# Patient Record
Sex: Male | Born: 1975 | Race: Black or African American | Hispanic: No | Marital: Single | Smoking: Never smoker
Health system: Southern US, Community
[De-identification: ages and names within clinical notes are randomized; demographics above are authoritative.]

---

## 1998-11-22 ENCOUNTER — Emergency Department (HOSPITAL_COMMUNITY): Admission: EM | Admit: 1998-11-22 | Discharge: 1998-11-22 | Payer: Self-pay | Admitting: Emergency Medicine

## 1999-10-11 ENCOUNTER — Emergency Department (HOSPITAL_COMMUNITY): Admission: EM | Admit: 1999-10-11 | Discharge: 1999-10-11 | Payer: Self-pay | Admitting: Emergency Medicine

## 2002-02-24 ENCOUNTER — Emergency Department (HOSPITAL_COMMUNITY): Admission: EM | Admit: 2002-02-24 | Discharge: 2002-02-24 | Payer: Self-pay | Admitting: Emergency Medicine

## 2004-02-04 ENCOUNTER — Emergency Department (HOSPITAL_COMMUNITY): Admission: AC | Admit: 2004-02-04 | Discharge: 2004-02-04 | Payer: Self-pay

## 2004-02-11 ENCOUNTER — Emergency Department (HOSPITAL_COMMUNITY): Admission: EM | Admit: 2004-02-11 | Discharge: 2004-02-11 | Payer: Self-pay | Admitting: Family Medicine

## 2005-01-29 ENCOUNTER — Emergency Department (HOSPITAL_COMMUNITY): Admission: EM | Admit: 2005-01-29 | Discharge: 2005-01-29 | Payer: Self-pay | Admitting: Family Medicine

## 2005-01-30 ENCOUNTER — Ambulatory Visit (HOSPITAL_COMMUNITY): Admission: RE | Admit: 2005-01-30 | Discharge: 2005-01-30 | Payer: Self-pay | Admitting: Surgery

## 2005-01-30 ENCOUNTER — Encounter (INDEPENDENT_AMBULATORY_CARE_PROVIDER_SITE_OTHER): Payer: Self-pay | Admitting: Specialist

## 2005-02-01 ENCOUNTER — Emergency Department (HOSPITAL_COMMUNITY): Admission: EM | Admit: 2005-02-01 | Discharge: 2005-02-01 | Payer: Self-pay | Admitting: Emergency Medicine

## 2007-08-09 ENCOUNTER — Emergency Department (HOSPITAL_COMMUNITY): Admission: EM | Admit: 2007-08-09 | Discharge: 2007-08-10 | Payer: Self-pay | Admitting: Emergency Medicine

## 2008-02-04 ENCOUNTER — Emergency Department (HOSPITAL_COMMUNITY): Admission: EM | Admit: 2008-02-04 | Discharge: 2008-02-04 | Payer: Self-pay | Admitting: Family Medicine

## 2008-10-05 ENCOUNTER — Emergency Department (HOSPITAL_COMMUNITY): Admission: EM | Admit: 2008-10-05 | Discharge: 2008-10-05 | Payer: Self-pay | Admitting: Family Medicine

## 2009-08-23 ENCOUNTER — Emergency Department (HOSPITAL_COMMUNITY): Admission: EM | Admit: 2009-08-23 | Discharge: 2009-08-23 | Payer: Self-pay | Admitting: Emergency Medicine

## 2010-08-29 NOTE — H&P (Signed)
NAMERHONIN, TROTT NO.:  [id number]   MEDICAL RECORD NO.:  [id number]          PATIENT TYPE:  EMS   LOCATION:  MAJO                         FACILITY:  [facility]   PHYSICIAN:  Clovis Pu. Cornett, M.D.DATE OF BIRTH:  Jul 13, 1975   DATE OF ADMISSION:  02/01/2005  DATE OF DISCHARGE:                                HISTORY & PHYSICAL   CHIEF COMPLAINT:  No bowel movement, difficulty voiding.   HISTORY OF PRESENT ILLNESS:  The patient is a 35 year old male who underwent  an open hemorrhoidectomy on Friday due to gangrenous hemorrhoids.  He has  had some problems voiding.  He was able to void yesterday, but has had  problems since then.  He has the urge to void, but has problems passing his  void.  He is on Flomax 0.4 mg a day.  He also had no bowel movement and  feels he needs to have one.   PHYSICAL EXAMINATION:  On examination today, his operative site is clean,  dry, and intact with no signs of infection.  He is full in his lower  abdomen.   IMPRESSION:  1.  Urine retention.  2.  Constipation.   PLAN:  I will place a Foley catheter in him with a leg bag, and have him  return to my office in 48 hours for removal.  I have told him to go ahead  and take Dulcolax one tablet every 6 hours, as well as milk of magnesia 30  cc p.o. every 4-6 hours until his first bowel movement.  He is not having  any nausea or vomiting or problems drinking liquids or eating at this point  in time.      Hamill A. Cornett, M.D.  Electronically Signed     TAC/MEDQ  D:  02/01/2005  T:  02/01/2005  Job:  [id number]   cc:   [facility]

## 2010-08-29 NOTE — Op Note (Signed)
[name], [name]               ACCOUNT NO.:  [id number]   MEDICAL RECORD NO.:  [id number]          PATIENT TYPE:  AMB   LOCATION:  SDS                          FACILITY:  [facility]   PHYSICIAN:  Diers A. Cornett, M.D.DATE OF BIRTH:  02-22-1976   DATE OF PROCEDURE:  01/30/2005  DATE OF DISCHARGE:  01/30/2005                                 OPERATIVE REPORT   PREOPERATIVE DIAGNOSIS:  Grade 3 prolapsed internal/external hemorrhoids.   POSTOPERATIVE DIAGNOSIS:  Grade 3 prolapsed internal/external hemorrhoids.   PROCEDURE:  Two-column internal/external hemorrhoidectomy.   SURGEON:  Maisie Fus A. Cornett, M.D.   ANESTHESIA:  LMA with 20 cc of 0.25% Sensorcaine local.   ESTIMATED BLOOD LOSS:  100 cc.   SPECIMEN:  Two hemorrhoids to pathology.   DRAINS:  None.   INDICATIONS FOR PROCEDURE:  The patient is a 35 year old male who has had  painful, prolapsed, grade 3 internal hemorrhoids.  He was see yesterday and  had these reduced, but overnight, his symptoms did not improve.  Despite  medical management, he returned to the office today.  We offered him  hemorrhoidectomy for control of his pain.  The procedure was explained to  him, and he voiced understanding and agreed to proceed.   DESCRIPTION OF PROCEDURE:  The patient was brought to the operating suite  and placed supine.  After induction of LMA anesthesia, he was placed in  lithotomy.  The perineum was prepped and draped in a sterile fashion.  The  scrotum and testes were wrapped in a towel and taped out of the way.  Digital examination revealed normal tone and a very large, grade 3,  minimally necrotic hemorrhoid in the right anterior position.  He had a  smaller hemorrhoid in the left lateral position which was also grade 3 and  was no prolapsed quite as far.  A proctoscope was placed, and the hemorrhoid  in the right lateral position was identified.  I injected this area with  0.25% Sensorcaine.  A stitch of 3-0 Monocryl was  placed at the apex of the  hemorrhoid, and a curved Mayo was used to cut the internal and external  component out, staying parallel to the sphincter complex.  Once this was  done, it was passed off the field.  I oversewed the mucosa with the 3-0  Monocryl and tied it at the end, leaving the very end of it open for  drainage.  Hemostasis was good.  Next, a second column of hemorrhoid disease  was noted in the left lateral position.  Again, this was injected with 0.25%  Sensorcaine.  A stitch was placed at the apex of 3-0 Monocryl, and a curved  Mayo was used to cut the hemorrhoid off, keeping the scissors parallel to  the sphincter mechanism.  This was passed off the field.  I oversewed the  mucosa with the 3-0 Monocryl, leaving the end open for drainage.  Hemostasis  was excellent, and this was irrigated and found to be hemostatic.  At this  point in time, I placed Gelfoam in the anal canal.  Dry dressings were then  applied.  All sponge, needle, and instrument counts were  found to be correct.  Prior to doing this, I did a repeat of my digital  examination.  He did not feel narrowed, and I could my finger easily through  the operative field.  The patient was then awoke and taken to recovery in  satisfactory condition.  Two percent Xylocaine jelly was also used as well.      Hice A. Cornett, M.D.  Electronically Signed     TAC/MEDQ  D:  01/30/2005  T:  01/31/2005  Job:  [id number]   cc:   [facility]

## 2011-02-28 IMAGING — CR DG FOOT COMPLETE 3+V*R*
3 series · 3 of 3 positions shown · non-contrast
Comparison: None.

CLINICAL DATA: Right foot injury 2 weeks ago, increasing pain
involving the distal metatarsals.

RIGHT FOOT COMPLETE - 3+ VIEW 08/23/2009:

[view not recorded (1 of 3)]
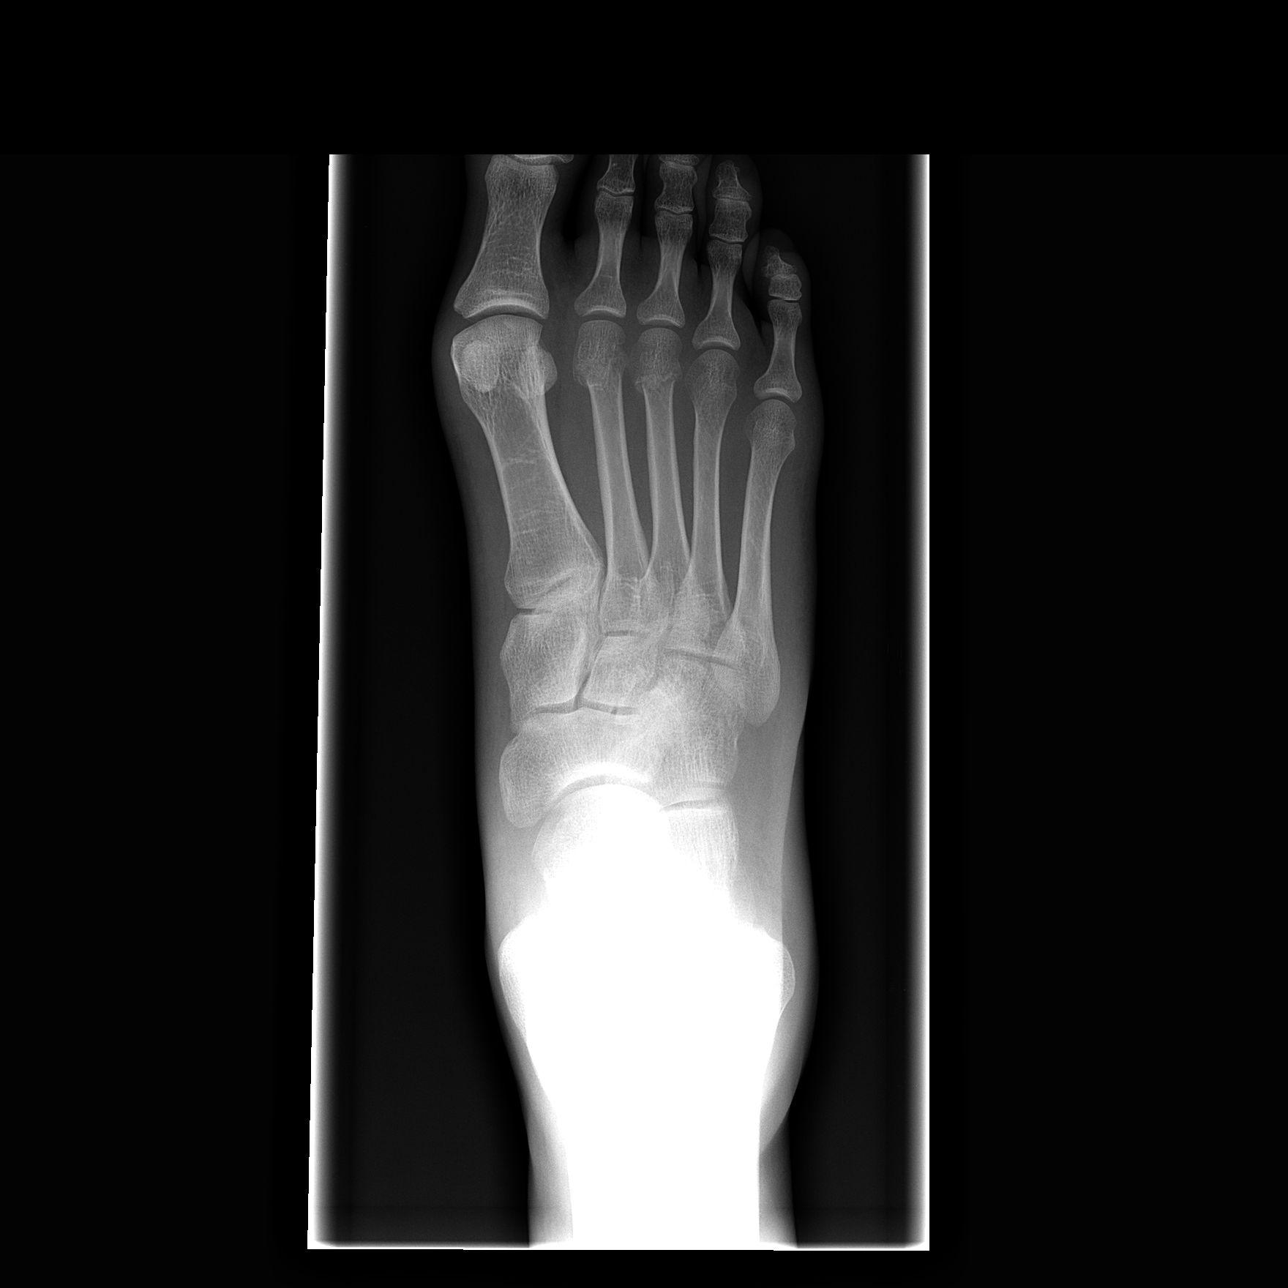

[view not recorded (2 of 3)]
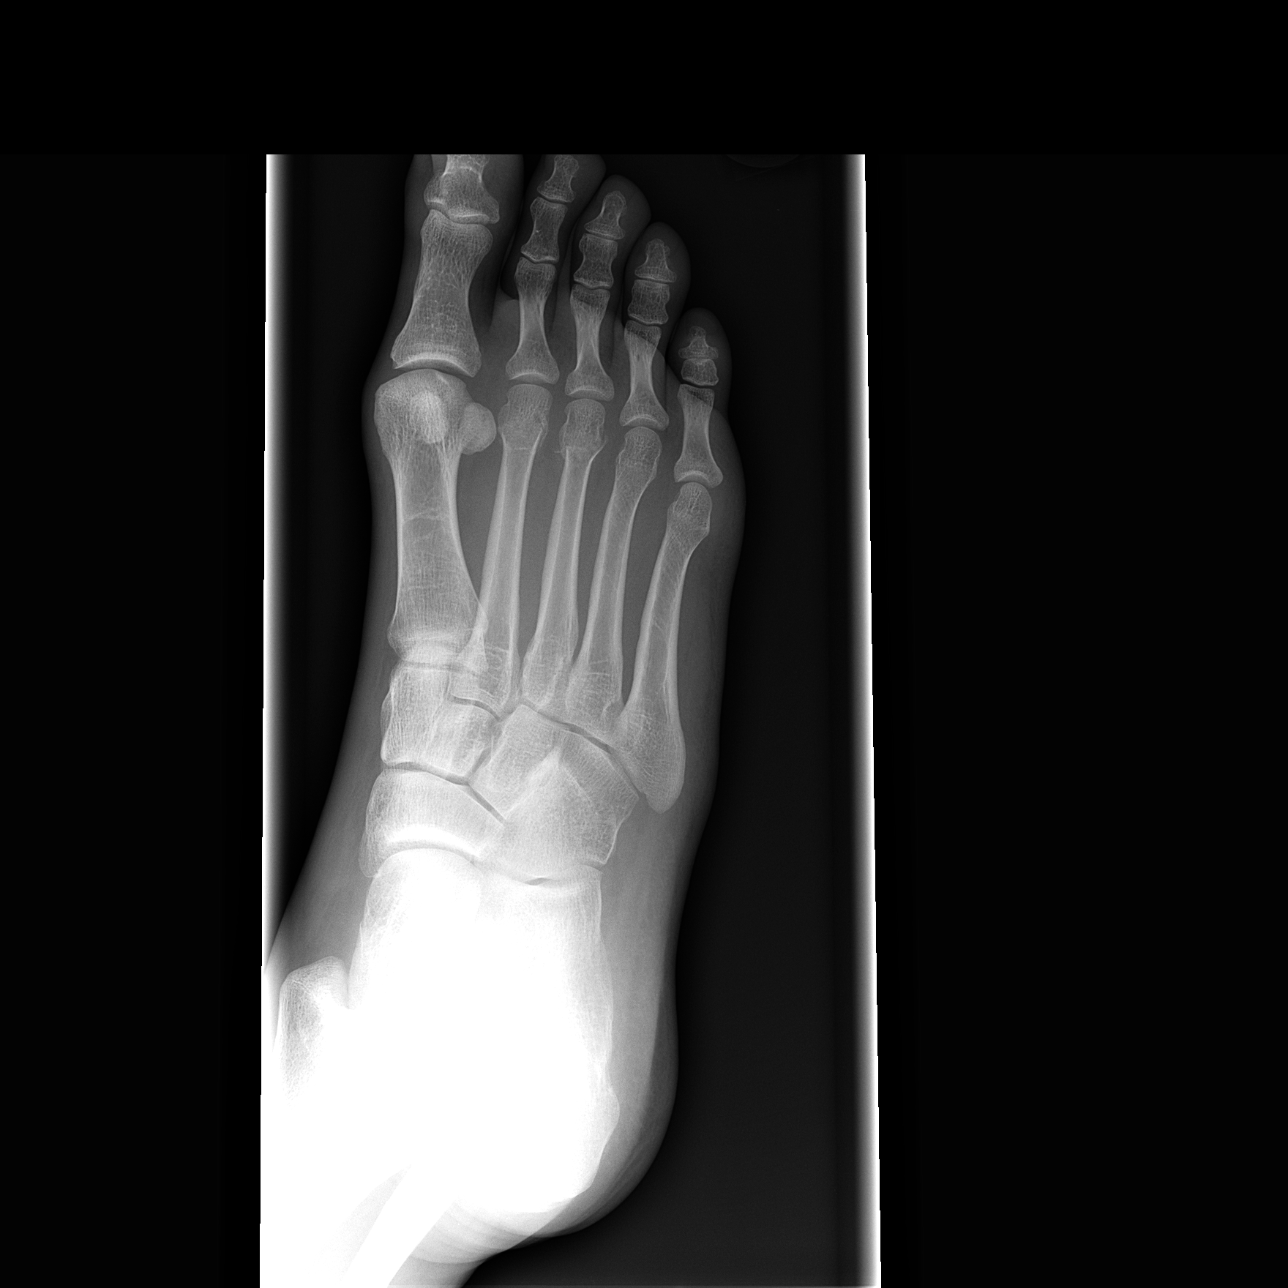

[view not recorded (3 of 3)]
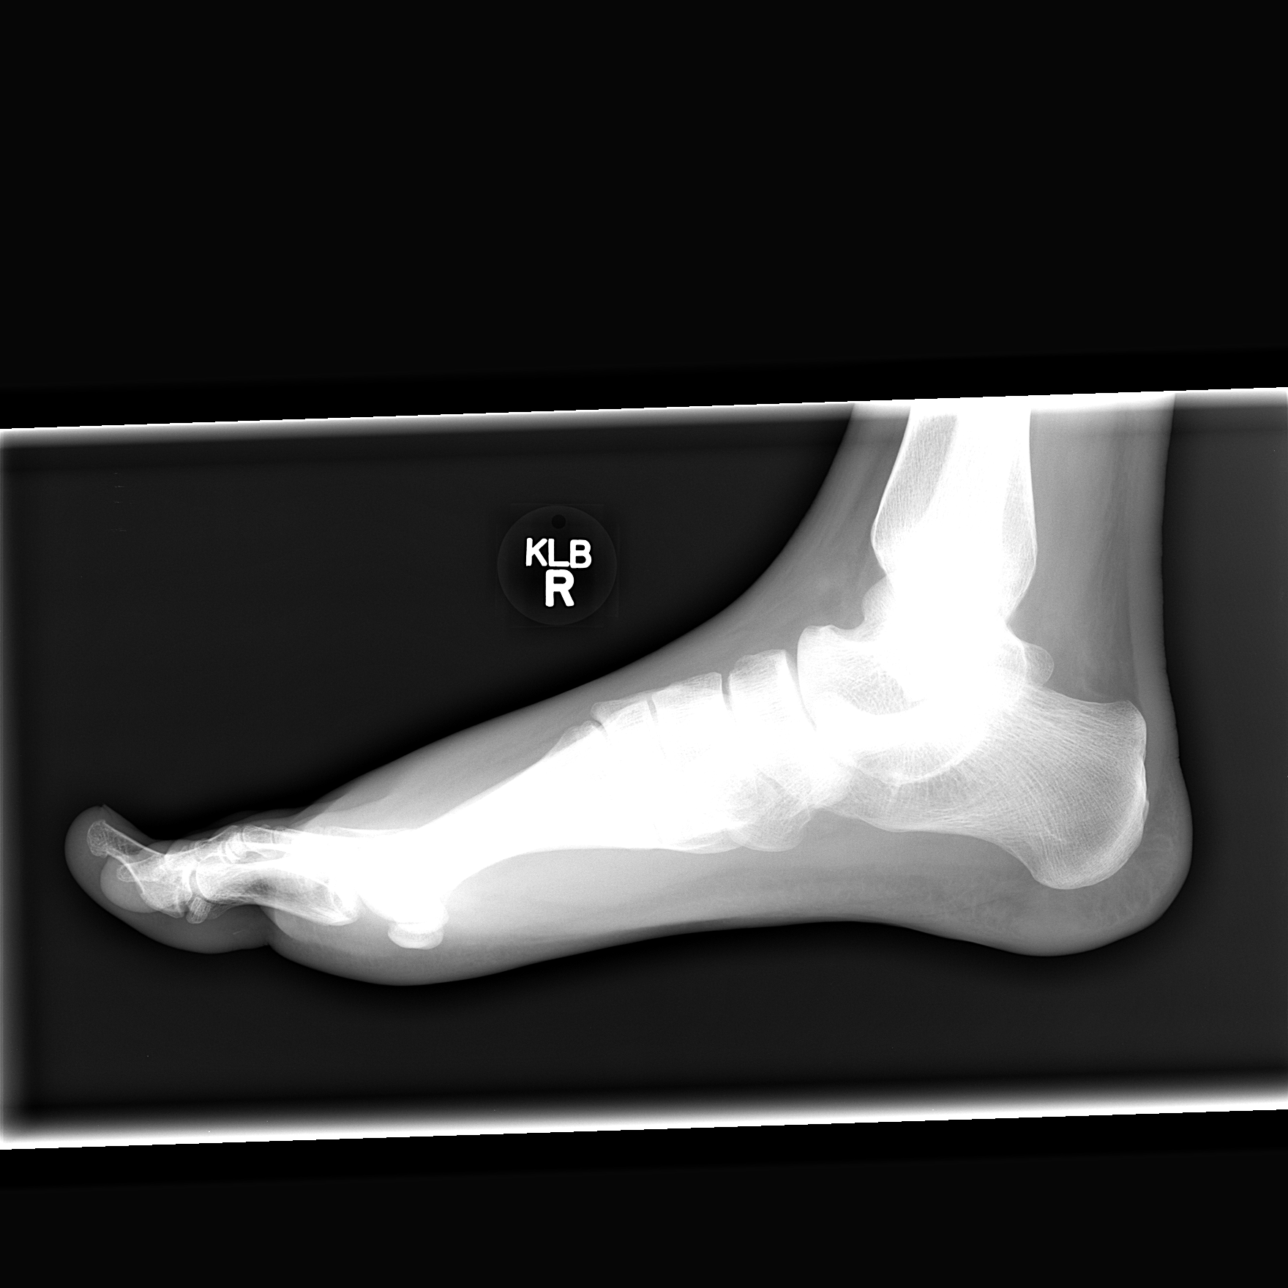

[3 of 3 positions shown; findings below may reference images not displayed]

FINDINGS: Subacute fractures involving the heads of the second and
third metatarsals.  No other visible fractures.  Well-preserved
joint spaces.  Well-preserved bone mineral density.  Dorsal soft
tissue swelling overlying the metatarsal heads.
IMPRESSION: Subacute fractures involving the heads of the second and third
metatarsals.

## 2013-02-14 ENCOUNTER — Encounter (HOSPITAL_COMMUNITY): Payer: Self-pay | Admitting: Emergency Medicine

## 2013-02-14 ENCOUNTER — Emergency Department (INDEPENDENT_AMBULATORY_CARE_PROVIDER_SITE_OTHER)
Admission: EM | Admit: 2013-02-14 | Discharge: 2013-02-14 | Disposition: A | Discharge status: Home / Self Care | Payer: Self-pay | Source: Home / Self Care

## 2013-02-14 DIAGNOSIS — R310 Gross hematuria: Secondary | ICD-10-CM

## 2013-02-14 LAB — POCT URINALYSIS DIP (DEVICE)
Bilirubin Urine: NEGATIVE
Glucose, UA: NEGATIVE mg/dL
Ketones, ur: NEGATIVE mg/dL
Leukocytes, UA: NEGATIVE
Nitrite: NEGATIVE
Protein, ur: NEGATIVE mg/dL
Specific Gravity, Urine: 1.015 (ref 1.005–1.030)
Urobilinogen, UA: 0.2 mg/dL (ref 0.0–1.0)
pH: 7 (ref 5.0–8.0)

## 2013-02-14 NOTE — ED Notes (Signed)
Saw blood in his urine today @ 12 N- just 1 time.  C/o urinary frequency for over 1 week.  States it hurt today when he urinated- stinging.  No hx. of kidney stones. No back pain.  Denies penile discharge.

## 2013-02-14 NOTE — ED Provider Notes (Signed)
CSN: [id number]     Arrival date & time 02/14/13  1830 History   None    Chief Complaint  Patient presents with  . Hematuria   (Consider location/radiation/quality/duration/timing/severity/associated sxs/prior Treatment) HPI  Blood in urine today at 2pm. Noticed pure blood coming from end of penis. Same sexual partner for past 5 years. No condom use. Denies dysuria, frequency, abd pain, penile discharge. Less noticeable w/ last urination  History reviewed. No pertinent past medical history. History reviewed. No pertinent past surgical history. Family History  Problem Relation Age of Onset  . Heart attack Father    History  Substance Use Topics  . Smoking status: Never Smoker   . Smokeless tobacco: Not on file  . Alcohol Use: No    Review of Systems  Genitourinary: Positive for hematuria. Negative for dysuria, flank pain, scrotal swelling, enuresis, difficulty urinating, genital sores and penile pain.  All other systems reviewed and are negative.    Allergies  Review of patient's allergies indicates no known allergies.  Home Medications   Current Outpatient Rx  Name  Route  Sig  Dispense  Refill  . beta carotene w/minerals (OCUVITE) tablet   Oral   Take 1 tablet by mouth daily.         . Multiple Vitamins-Minerals (MULTIVITAMIN WITH MINERALS) tablet   Oral   Take 1 tablet by mouth daily.          BP 106/67  Pulse 84  Temp(Src) 97.9 F (36.6 C) (Oral)  Resp 16  SpO2 100% Physical Exam  Constitutional: He is oriented to person, place, and time. He appears well-developed and well-nourished. No distress.  HENT:  Head: Normocephalic.  Eyes: EOM are normal. Pupils are equal, round, and reactive to light.  Neck: Normal range of motion.  Cardiovascular: Normal rate.   Pulmonary/Chest: Effort normal. No respiratory distress.  Abdominal: Soft. He exhibits no distension.  Genitourinary: Penis normal. No penile tenderness.  Scrotum and testicles nml   Musculoskeletal: Normal range of motion.  Neurological: He is alert and oriented to person, place, and time.  Skin: Skin is warm and dry.  Psychiatric: He has a normal mood and affect. His behavior is normal. Judgment and thought content normal.    ED Course  Procedures (including critical care time) Labs Review Labs Reviewed  POCT URINALYSIS DIP (DEVICE) - Abnormal; Notable for the following:    Hgb urine dipstick MODERATE (*)    All other components within normal limits   Imaging Review No results found.  EKG Interpretation     Ventricular Rate:    PR Interval:    QRS Duration:   QT Interval:    QTC Calculation:   R Axis:     Text Interpretation:              MDM   1. Hematuria, gross    36yo M w/ hematuria. No evidence of kidney stones, overt infection or concern for mass. Likely nml physiologic variant. Recommend f/u UA in 3 mo. And further eval at that time if present (urology consult).  - precautions given - all questions answered.   Shelly Flatten, MD Family Medicine PGY-3 02/14/2013, 9:00 PM      Ozella Rocks, MD 02/14/13 2101

## 2013-02-15 NOTE — ED Provider Notes (Signed)
Medical screening examination/treatment/procedure(s) were performed by resident physician or non-physician practitioner and as supervising physician I was immediately available for consultation/collaboration.   Barkley Bruns MD.   Linna Hoff, MD 02/15/13 2001

## 2013-02-17 NOTE — ED Notes (Signed)
Called , had questions about his visit

## 2015-07-12 ENCOUNTER — Encounter (HOSPITAL_COMMUNITY): Payer: Self-pay | Admitting: Emergency Medicine

## 2015-07-12 ENCOUNTER — Emergency Department (INDEPENDENT_AMBULATORY_CARE_PROVIDER_SITE_OTHER)
Admission: EM | Admit: 2015-07-12 | Discharge: 2015-07-12 | Disposition: A | Discharge status: Home / Self Care | Payer: Self-pay | Source: Home / Self Care | Attending: Emergency Medicine | Admitting: Emergency Medicine

## 2015-07-12 ENCOUNTER — Emergency Department (INDEPENDENT_AMBULATORY_CARE_PROVIDER_SITE_OTHER): Payer: Self-pay

## 2015-07-12 DIAGNOSIS — B349 Viral infection, unspecified: Secondary | ICD-10-CM

## 2015-07-12 DIAGNOSIS — R0982 Postnasal drip: Secondary | ICD-10-CM

## 2015-07-12 DIAGNOSIS — F449 Dissociative and conversion disorder, unspecified: Secondary | ICD-10-CM

## 2015-07-12 NOTE — ED Notes (Addendum)
Fever, body aches, diarrhea x 3 days, loss of appetite.  2 episodes of diarrhea today.  Vomited earlier in the week, but none now.   Patient has made it clear he was "out of work 3 consecutive days.  i need a note"

## 2015-07-12 NOTE — ED Notes (Signed)
Patient received instructions and work note

## 2015-07-12 NOTE — Discharge Instructions (Signed)
Viral Infections °For nasal and head congestion may take Sudafed PE 10 mg every 4 hours as needed. °Saline nasal spray used frequently. °For drainage may use Allegra, Claritin or Zyrtec. If you need stronger medicine to stop drainage may take Chlor-Trimeton 2-4 mg every 4 hours. This may cause drowsiness. °Ibuprofen 600 mg every 6 hours as needed for pain, discomfort or fever. °Drink plenty of fluids and stay well-hydrated. ° °A viral infection can be caused by different types of viruses. Most viral infections are not serious and resolve on their own. However, some infections may cause severe symptoms and may lead to further complications. °SYMPTOMS °Viruses can frequently cause: °· Minor sore throat. °· Aches and pains. °· Headaches. °· Runny nose. °· Different types of rashes. °· Watery eyes. °· Tiredness. °· Cough. °· Loss of appetite. °· Gastrointestinal infections, resulting in nausea, vomiting, and diarrhea. °These symptoms do not respond to antibiotics because the infection is not caused by bacteria. However, you might catch a bacterial infection following the viral infection. This is sometimes called a "superinfection." Symptoms of such a bacterial infection may include: °· Worsening sore throat with pus and difficulty swallowing. °· Swollen neck glands. °· Chills and a high or persistent fever. °· Severe headache. °· Tenderness over the sinuses. °· Persistent overall ill feeling (malaise), muscle aches, and tiredness (fatigue). °· Persistent cough. °· Yellow, green, or brown mucus production with coughing. °HOME CARE INSTRUCTIONS  °· Only take over-the-counter or prescription medicines for pain, discomfort, diarrhea, or fever as directed by your caregiver. °· Drink enough water and fluids to keep your urine clear or pale yellow. Sports drinks can provide valuable electrolytes, sugars, and hydration. °· Get plenty of rest and maintain proper nutrition. Soups and broths with crackers or rice are fine. °SEEK  IMMEDIATE MEDICAL CARE IF:  °· You have severe headaches, shortness of breath, chest pain, neck pain, or an unusual rash. °· You have uncontrolled vomiting, diarrhea, or you are unable to keep down fluids. °· You or your child has an oral temperature above 102° F (38.9° C), not controlled by medicine. °· Your baby is older than 3 months with a rectal temperature of 102° F (38.9° C) or higher. °· Your baby is 3 months old or younger with a rectal temperature of 100.4° F (38° C) or higher. °MAKE SURE YOU:  °· Understand these instructions. °· Will watch your condition. °· Will get help right away if you are not doing well or get worse. °  °This information is not intended to replace advice given to you by your health care provider. Make sure you discuss any questions you have with your health care provider. °  °Document Released: 01/07/2005 Document Revised: 06/22/2011 Document Reviewed: 09/05/2014 °Elsevier Interactive Patient Education ©2016 [employer] ° °

## 2015-07-12 NOTE — ED Provider Notes (Signed)
CSN: [id number]     Arrival date & time 07/12/15  1602 History   First MD Initiated Contact with Patient 07/12/15 1814     Chief Complaint  Patient presents with  . Fever   (Consider location/radiation/quality/duration/timing/severity/associated sxs/prior Treatment) HPI Comments: 40 year old male with a one-week history of decreased appetite, undocumented fever, aches, chills, body aches and 1-2 days of vomiting and diarrhea at the outset. His also complaining of fatigue and malaise. States he has had fever today but on arrival his temperature is 98.2.   History reviewed. No pertinent past medical history. History reviewed. No pertinent past surgical history. Family History  Problem Relation Age of Onset  . Heart attack Father    Social History  Substance Use Topics  . Smoking status: Never Smoker   . Smokeless tobacco: None  . Alcohol Use: No    Review of Systems  Constitutional: Positive for fever, chills, activity change, appetite change, fatigue and unexpected weight change.  HENT: Negative for congestion, ear pain, hearing loss, postnasal drip, rhinorrhea, sore throat and trouble swallowing.   Eyes: Negative.   Respiratory: Negative for cough.        Shortness of breath sometimes.  Cardiovascular: Negative for chest pain and leg swelling.  Gastrointestinal:       See history of present illness  Genitourinary: Negative.   Musculoskeletal: Positive for myalgias and back pain. Negative for neck pain and neck stiffness.  Skin: Negative.   Neurological: Negative for tremors, seizures, syncope, speech difficulty, weakness and headaches.  Psychiatric/Behavioral: The patient is nervous/anxious.     Allergies  Review of patient's allergies indicates no known allergies.  Home Medications   Prior to Admission medications   Medication Sig Start Date End Date Taking? Authorizing Provider  beta carotene w/minerals (OCUVITE) tablet Take 1 tablet by mouth daily.    Historical  Provider, MD  Multiple Vitamins-Minerals (MULTIVITAMIN WITH MINERALS) tablet Take 1 tablet by mouth daily.    Historical Provider, MD   Meds Ordered and Administered this Visit  Medications - No data to display  BP 98/68 mmHg  Pulse 75  Temp(Src) 98.2 F (36.8 C) (Oral)  Resp 16  SpO2 99% No data found.   Physical Exam  Constitutional: He is oriented to person, place, and time. He appears well-developed and well-nourished. No distress.  He has a whiny speech pattern. Occasionally moans and groans, very talkative.  HENT:  Head: Normocephalic and atraumatic.  Mouth/Throat: No oropharyngeal exudate.  Oropharynx with minor erythema and moderate amount of clear PND and cobblestoning.  Eyes: Conjunctivae and EOM are normal.  Neck: Normal range of motion. Neck supple.  Cardiovascular: Normal rate and regular rhythm.   Pulmonary/Chest: Effort normal and breath sounds normal. No respiratory distress. He has no wheezes. He has no rales.  Tenderness to the left parathoracic/medial scapula area.  The patient is panting. When asked why he states it was because of pain in his left upper back. This was never mentioned in the initial set of symptoms. When distracted he stops the panting.  Abdominal: Soft. Bowel sounds are normal. He exhibits no distension and no mass. There is no tenderness. There is no rebound and no guarding.  Musculoskeletal: Normal range of motion. He exhibits no edema.  Lymphadenopathy:    He has no cervical adenopathy.  Neurological: He is alert and oriented to person, place, and time.  Skin: Skin is warm and dry. No rash noted.  Psychiatric: He has a normal mood and affect.  Nursing  note and vitals reviewed.   ED Course  Procedures (including critical care time)  Labs Review Labs Reviewed - No data to display  Imaging Review Dg Chest 2 View  07/12/2015  CLINICAL DATA:  Cough, fever. EXAM: CHEST  2 VIEW COMPARISON:  None. FINDINGS: The heart size and mediastinal  contours are within normal limits. Both lungs are clear. The visualized skeletal structures are unremarkable. IMPRESSION: No active cardiopulmonary disease. Electronically Signed   By: Lupita RaiderJames  Green Jr, M.D.   On: 07/12/2015 19:12     Visual Acuity Review  Right Eye Distance:   Left Eye Distance:   Bilateral Distance:    Right Eye Near:   Left Eye Near:    Bilateral Near:         MDM   1. Viral syndrome   2. Histrionic behavior   3. PND (post-nasal drip)    The patient may have had some sort of viral syndrome this week. His only sign today is that of postpharyngeal drainage. His vital signs are normal. His physical exam is unremarkable. His chest x-ray is clear. He has had somewhat of a histrionic behavior and difficult to examine today. After he was left alone for a while he had calmed down and he was no longer panting. His greatest concern that he mentioned to the nurse as well has the provider when first approached was that he needed a work note stating that he had the flu for a week and to excuse him out of work for this period of time.    Hayden Rasmussenavid Kaidyn Javid, NP 07/12/15 1941  Hayden Rasmussenavid Eh Sauseda, NP 07/12/15 [phone]

## 2016-04-25 ENCOUNTER — Ambulatory Visit (INDEPENDENT_AMBULATORY_CARE_PROVIDER_SITE_OTHER): Payer: Self-pay

## 2016-04-25 ENCOUNTER — Ambulatory Visit (HOSPITAL_COMMUNITY)
Admission: EM | Admit: 2016-04-25 | Discharge: 2016-04-25 | Disposition: A | Discharge status: Home / Self Care | Payer: Self-pay | Attending: Internal Medicine | Admitting: Internal Medicine

## 2016-04-25 ENCOUNTER — Encounter (HOSPITAL_COMMUNITY): Payer: Self-pay | Admitting: Emergency Medicine

## 2016-04-25 DIAGNOSIS — S62346A Nondisplaced fracture of base of fifth metacarpal bone, right hand, initial encounter for closed fracture: Secondary | ICD-10-CM

## 2016-04-25 MED ORDER — HYDROCODONE-ACETAMINOPHEN 10-325 MG PO TABS: 1.0000 | ORAL_TABLET | Freq: Four times a day (QID) | ORAL | 0 refills | Status: AC | PRN

## 2016-04-25 NOTE — ED Triage Notes (Signed)
Patient reports punching "solid oak" pain and swelling in right hand.

## 2016-04-25 NOTE — Progress Notes (Signed)
Orthopedic Tech Progress Note Patient Details:  Wynn MaudlinRonnie W Fieldhouse 03/07/1976 [id number]  Ortho Devices Type of Ortho Device: Arm sling, Ace wrap, Ulna gutter splint Ortho Device/Splint Interventions: Application   Saul FordyceJennifer C Arun Herrod 04/25/2016, 4:37 PM

## 2016-04-25 NOTE — ED Provider Notes (Signed)
CSN: [id number]     Arrival date & time 04/25/16  1407 History   First MD Initiated Contact with Patient 04/25/16 1535     Chief Complaint  Patient presents with  . Hand Injury   (Consider location/radiation/quality/duration/timing/severity/associated sxs/prior Treatment) 41 year old male presents with chief complaint of hand pain and swelling. States shortly after New Year's he "punched some oak" while angry and has had pain since. He has no loss of sensation in his fingers, but does have difficulty griping or forming a fist. He has no fever or other systemic symptoms.   The history is provided by the patient.  Hand Injury    History reviewed. No pertinent past medical history. History reviewed. No pertinent surgical history. Family History  Problem Relation Age of Onset  . Heart attack Father    Social History  Substance Use Topics  . Smoking status: Never Smoker  . Smokeless tobacco: Not on file  . Alcohol use No    Review of Systems  Reason unable to perform ROS: as covered in HPI.  All other systems reviewed and are negative.   Allergies  Patient has no known allergies.  Home Medications   Prior to Admission medications   Medication Sig Start Date End Date Taking? Authorizing Provider  beta carotene w/minerals (OCUVITE) tablet Take 1 tablet by mouth daily.    Historical Provider, MD  HYDROcodone-acetaminophen (NORCO) 10-325 MG tablet Take 1 tablet by mouth every 6 (six) hours as needed. 04/25/16   Dorena BodoLawrence Arienne Gartin, NP  Multiple Vitamins-Minerals (MULTIVITAMIN WITH MINERALS) tablet Take 1 tablet by mouth daily.    Historical Provider, MD   Meds Ordered and Administered this Visit  Medications - No data to display  BP 115/66 (BP Location: Left Arm)   Pulse 92   Temp 97.1 F (36.2 C) (Oral)   Resp 16   SpO2 100%  No data found.   Physical Exam  Constitutional: He is oriented to person, place, and time. He appears well-developed and well-nourished. No  distress.  Cardiovascular: Normal rate and regular rhythm.   Pulmonary/Chest: Effort normal and breath sounds normal.  Abdominal: Soft. Bowel sounds are normal.  Musculoskeletal: He exhibits edema, tenderness and deformity.       Arms: Neurological: He is alert and oriented to person, place, and time.  Skin: Skin is warm and dry. Capillary refill takes less than 2 seconds. He is not diaphoretic.  Psychiatric: He has a normal mood and affect.  Nursing note and vitals reviewed.   Urgent Care Course   Clinical Course     Procedures (including critical care time)  Labs Review Labs Reviewed - No data to display  Imaging Review Dg Hand Complete Right  Result Date: 04/25/2016 CLINICAL DATA:  Patient states he hit his hand against some hard wood x 2 weeks ago, pain along the 5th digit side of rt hand. EXAM: RIGHT HAND - COMPLETE 3+ VIEW COMPARISON:  None. FINDINGS: There is an oblique, nondisplaced, non comminuted fracture cross the proximal aspect of the right fifth metacarpal. No other fractures. Joints are normally spaced and aligned. IMPRESSION: Nondisplaced fracture of the proximal aspect of the right fifth metacarpal. Electronically Signed   By: Amie Portlandavid  Ormond M.D.   On: 04/25/2016 15:36     Visual Acuity Review  Right Eye Distance:   Left Eye Distance:   Bilateral Distance:    Right Eye Near:   Left Eye Near:    Bilateral Near:  MDM   1. Closed nondisplaced fracture of base of fifth metacarpal bone of right hand, initial encounter   You have a non-displaced fracture of the 5th metacarpale bone. I have placed an order for your hard to be splinted and I have written a prescription for Norco 10/acetaminophen 325 mg. This medicine is sedating so do not take with alcohol and avoid driving while taking. You will need to follow up with a hand specialist, I have provided the name of Dr. Amanda Pea, who is a hand specialist and will be able to manage your care. While here I have  ordered for an splint for your hand.      Dorena Bodo, NP 04/25/16 1559

## 2016-04-25 NOTE — Discharge Instructions (Signed)
You have a non-displaced fracture of the 5th metacarpale bone. I have placed an order for your hard to be splinted and I have written a prescription for Norco 10/acetaminophen 325 mg. This medicine is sedating so do not take with alcohol and avoid driving while taking. You will need to follow up with a hand specialist, I have provided the name of Dr. Amanda PeaGramig, who is a hand specialist and will be able to manage your care.

## 2016-05-07 ENCOUNTER — Ambulatory Visit (HOSPITAL_COMMUNITY)
Admission: EM | Admit: 2016-05-07 | Discharge: 2016-05-07 | Disposition: A | Discharge status: Home / Self Care | Payer: BLUE CROSS/BLUE SHIELD | Attending: Family Medicine | Admitting: Family Medicine

## 2016-05-07 ENCOUNTER — Encounter (HOSPITAL_COMMUNITY): Payer: Self-pay | Admitting: Emergency Medicine

## 2016-05-07 DIAGNOSIS — Z7689 Persons encountering health services in other specified circumstances: Secondary | ICD-10-CM

## 2016-05-07 DIAGNOSIS — S6291XD Unspecified fracture of right wrist and hand, subsequent encounter for fracture with routine healing: Secondary | ICD-10-CM

## 2016-05-07 NOTE — Discharge Instructions (Signed)
You will need to follow up with a hand specialist for evaluation of your fracture. The doctor you were referred to on your initial visit on 04/25/16 was Dr. Amanda PeaGramig, I have attached his contact information in this paperwork. I would follow up with him prior to removal of the splint. You may continue to work and I have attached a letter of clearance.

## 2016-05-07 NOTE — ED Provider Notes (Signed)
CSN: [id number]     Arrival date & time 05/07/16  1104 History   None    Chief Complaint  Patient presents with  . Medical Clearance   (Consider location/radiation/quality/duration/timing/severity/associated sxs/prior Treatment) 41 year old male presents to clinic requesting clearance to return to work. He was seen in clinic on 04/25/16 and diagnosed with a fracture of the 5th metatarsal. His hand was splinted and he was referred to Dr. Carlos LeveringGramig's office for follow up care and management. Patient has not yet scheduled an appointment with ortho. 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      History reviewed. No pertinent past medical history. History reviewed. No pertinent surgical history. Family History  Problem Relation Age of Onset  . Heart attack Father    Social History  Substance Use Topics  . Smoking status: Never Smoker  . Smokeless tobacco: Never Used  . Alcohol use No    Review of Systems  Reason unable to perform ROS: as covered in HPI.  All other systems reviewed and are negative.   Allergies  Patient has no known allergies.  Home Medications   Prior to Admission medications   Medication Sig Start Date End Date Taking? Authorizing Provider  beta carotene w/minerals (OCUVITE) tablet Take 1 tablet by mouth daily.    Historical Provider, MD  HYDROcodone-acetaminophen (NORCO) 10-325 MG tablet Take 1 tablet by mouth every 6 (six) hours as needed. 04/25/16   Dorena BodoLawrence Rubye Strohmeyer, NP  Multiple Vitamins-Minerals (MULTIVITAMIN WITH MINERALS) tablet Take 1 tablet by mouth daily.    Historical Provider, MD   Meds Ordered and Administered this Visit  Medications - No data to display  BP 103/63 (BP Location: Left Arm)   Pulse 79   Temp 98.4 F (36.9 C) (Oral)   Resp 16   SpO2 96%  No data found.   Physical Exam  Constitutional: He is oriented to person, place, and time. He appears  well-developed and well-nourished. No distress.  HENT:  Head: Normocephalic and atraumatic.  Musculoskeletal:  Right hand splinted from previous visit, PMS remains intact in the fingers distal to the fracture.  Neurological: He is alert and oriented to person, place, and time.  Skin: Skin is warm and dry. Capillary refill takes less than 2 seconds. He is not diaphoretic.  Psychiatric: He has a normal mood and affect.  Nursing note and vitals reviewed.   Urgent Care Course     Procedures (including critical care time)  Labs Review Labs Reviewed - No data to display  Imaging Review No results found.   Visual Acuity Review  Right Eye Distance:   Left Eye Distance:   Bilateral Distance:    Right Eye Near:   Left Eye Near:    Bilateral Near:         MDM   1. Return to work exam   2. Closed fracture of right hand with routine healing, subsequent encounter    You will need to follow up with a hand specialist for evaluation of your fracture. The doctor you were referred to on your initial visit on 04/25/16 was Dr. Amanda PeaGramig, I have attached his contact information in this paperwork. I would follow up with him prior to removal of the splint. You may continue to work and I have attached a letter of clearance.        Dorena BodoLawrence Emilliano Dilworth, NP 05/07/16 1243

## 2016-05-07 NOTE — ED Triage Notes (Signed)
Pt reports he needs medical clearance to go back to work  States he feels fine and wants to go back to work   A&O x4... NAD

## 2017-10-31 IMAGING — DX DG HAND COMPLETE 3+V*R*
3 series · 3 of 3 positions shown · non-contrast
Comparison: None.

CLINICAL DATA: Patient states he hit his hand against some hard
[employer] 2 weeks ago, pain along the 5th digit side of rt hand.

EXAM:
RIGHT HAND - COMPLETE 3+ VIEW

[hand pa]
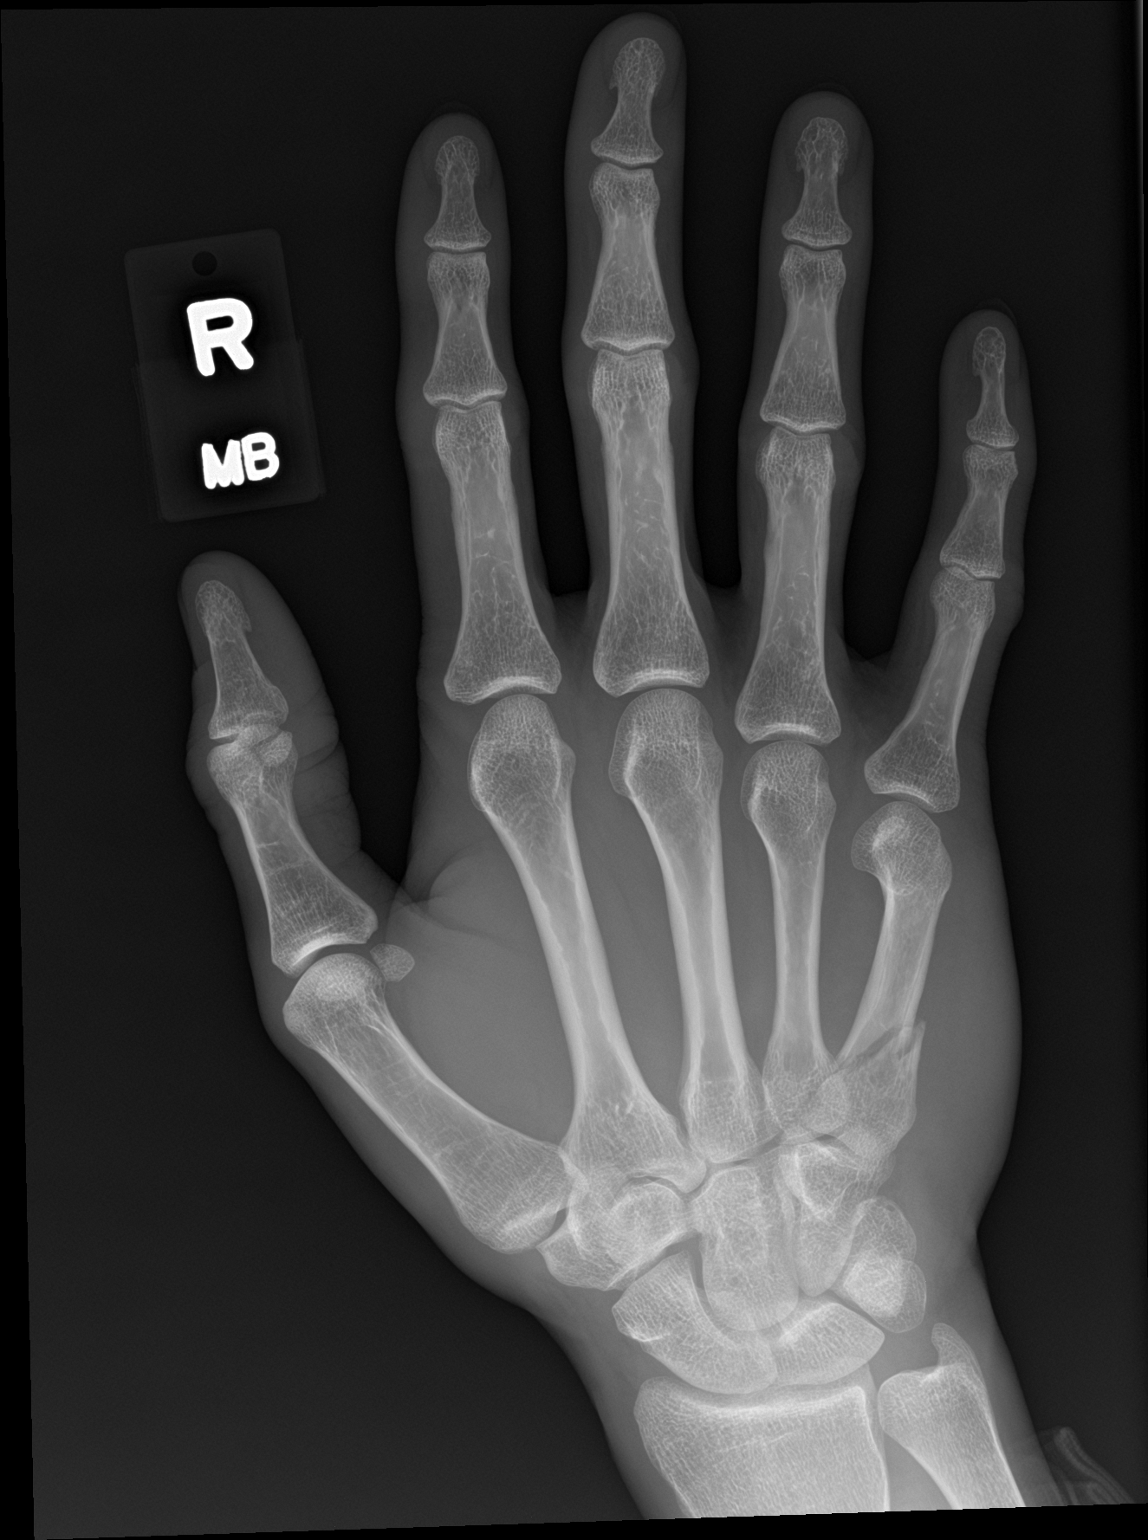

[hand obl]
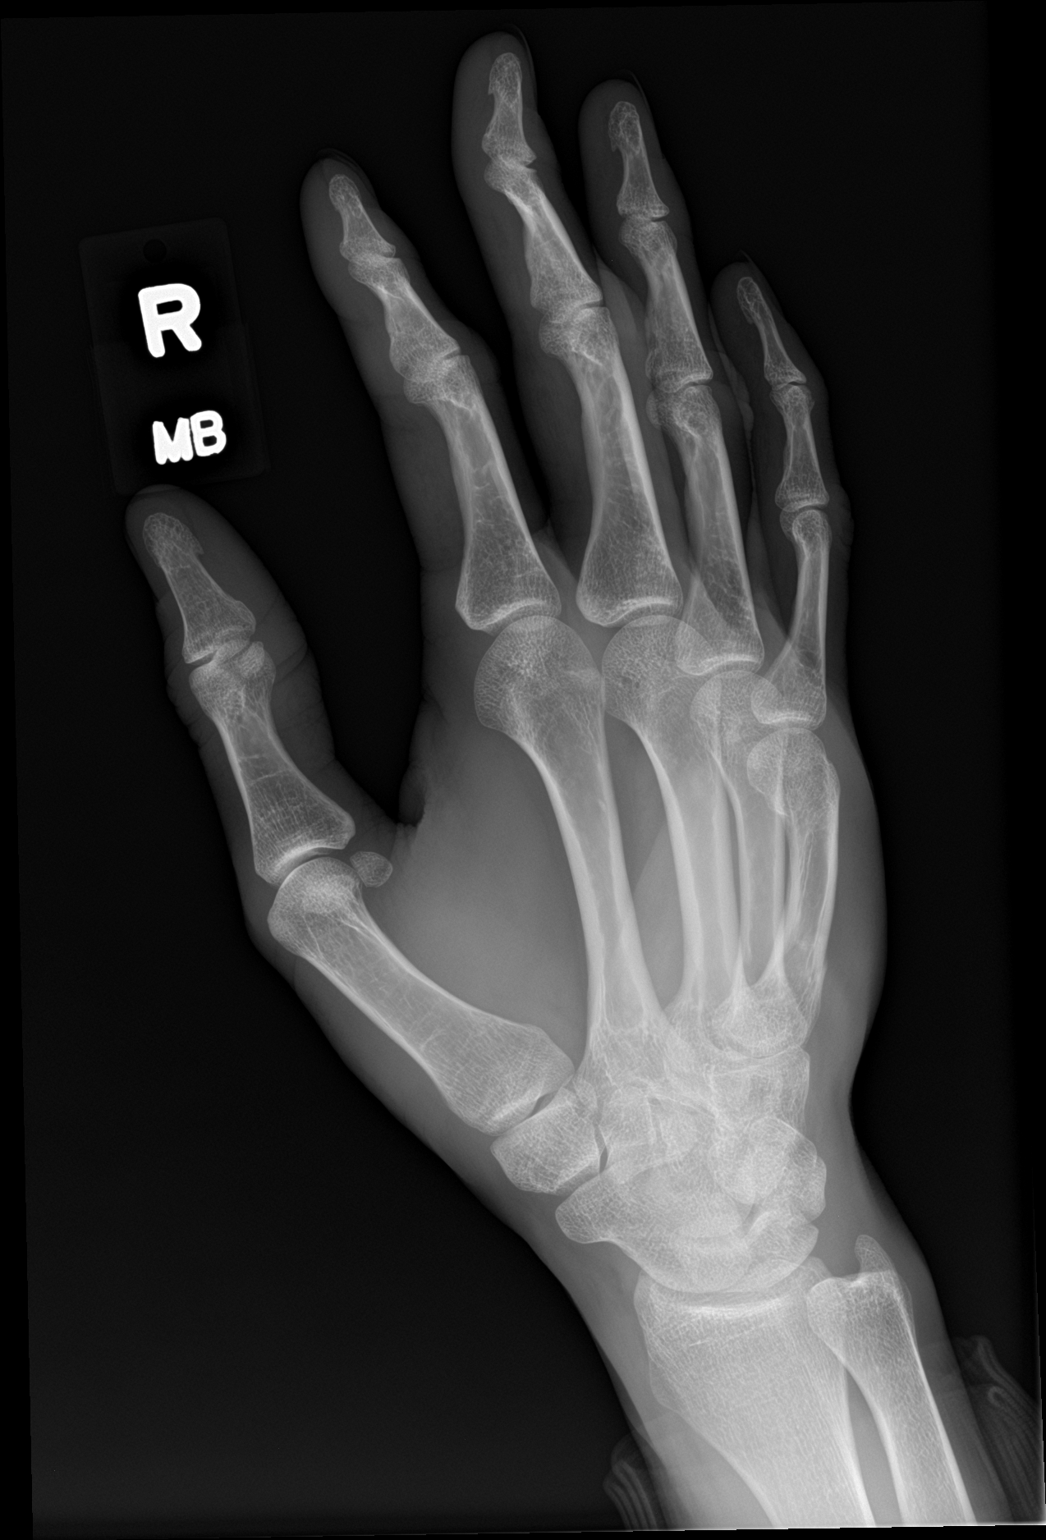

[hand lat]
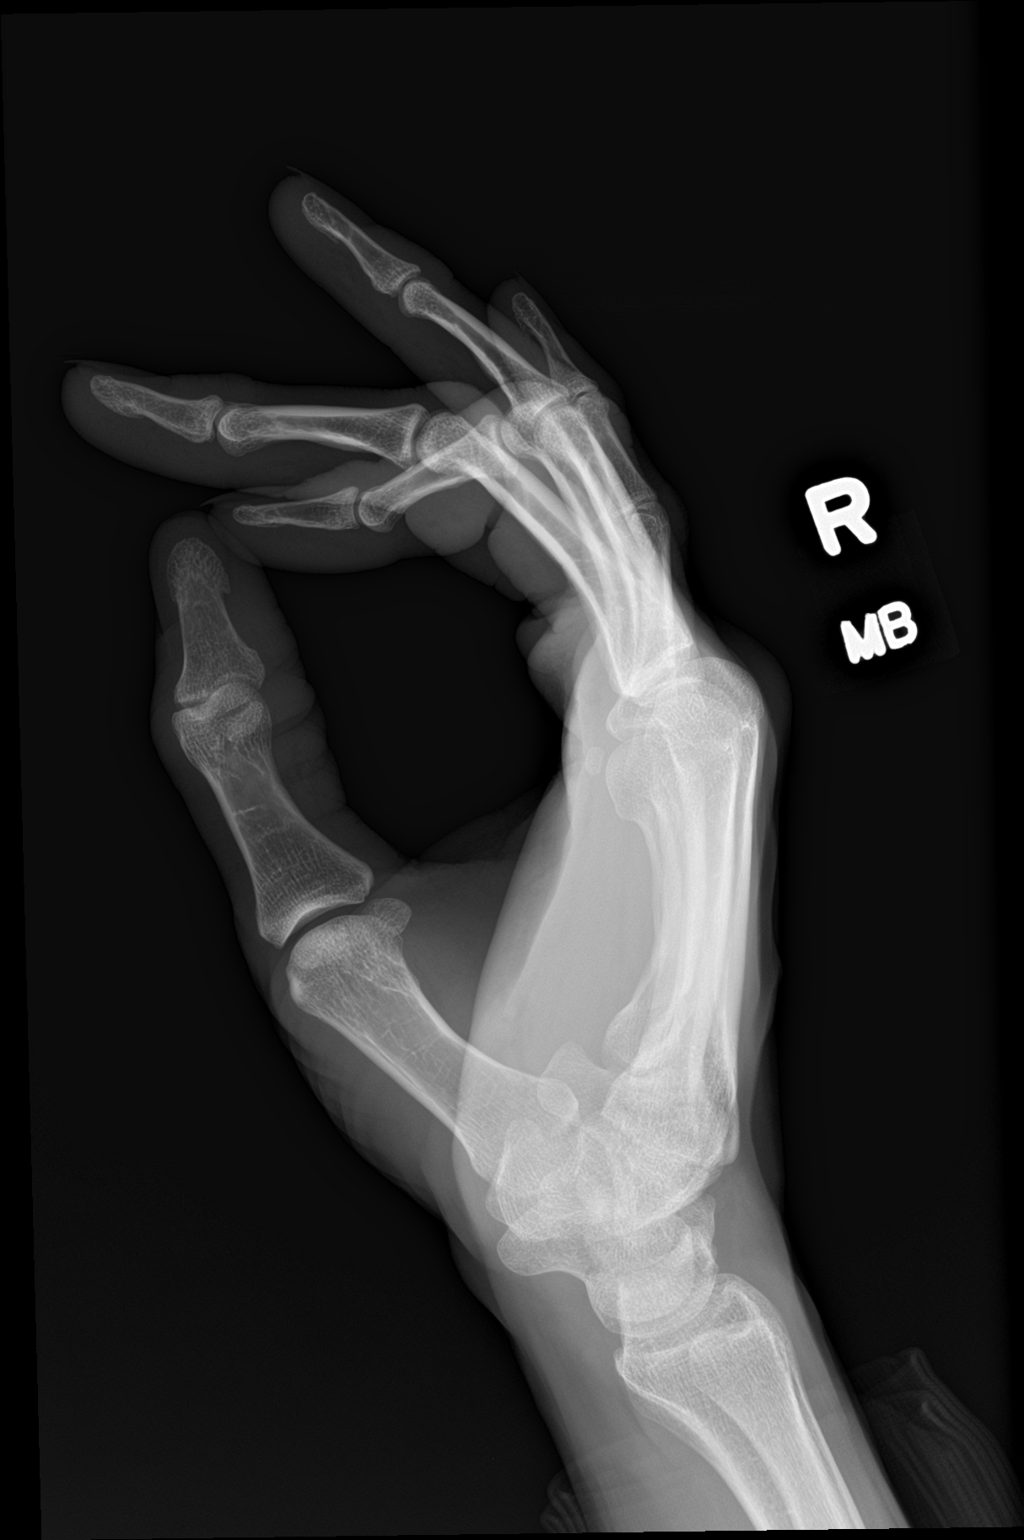

[3 of 3 positions shown; findings below may reference images not displayed]

FINDINGS: There is an oblique, nondisplaced, non comminuted fracture cross the
proximal aspect of the right fifth metacarpal. No other fractures.

Joints are normally spaced and aligned.
IMPRESSION: Nondisplaced fracture of the proximal aspect of the right fifth
metacarpal.
